# Patient Record
Sex: Female | Born: 1955
Health system: Southern US, Community
[De-identification: ages and names within clinical notes are randomized; demographics above are authoritative.]

## PROBLEM LIST (undated history)

## (undated) DIAGNOSIS — K219 Gastro-esophageal reflux disease without esophagitis: Secondary | ICD-10-CM

## (undated) DIAGNOSIS — E785 Hyperlipidemia, unspecified: Secondary | ICD-10-CM

## (undated) DIAGNOSIS — T7840XA Allergy, unspecified, initial encounter: Secondary | ICD-10-CM

## (undated) DIAGNOSIS — Z789 Other specified health status: Secondary | ICD-10-CM

## (undated) HISTORY — DX: Allergy, unspecified, initial encounter: T78.40XA

## (undated) HISTORY — DX: Gastro-esophageal reflux disease without esophagitis: K21.9

## (undated) HISTORY — PX: ABDOMINAL HYSTERECTOMY: SHX81

## (undated) HISTORY — DX: Hyperlipidemia, unspecified: E78.5

---
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
## 1998-07-25 ENCOUNTER — Ambulatory Visit (HOSPITAL_COMMUNITY): Admission: RE | Admit: 1998-07-25 | Discharge: 1998-07-25 | Payer: Self-pay | Admitting: *Deleted

## 2000-02-01 ENCOUNTER — Encounter: Payer: Self-pay | Admitting: General Practice

## 2000-02-01 ENCOUNTER — Other Ambulatory Visit: Admission: RE | Admit: 2000-02-01 | Discharge: 2000-02-01 | Payer: Self-pay | Admitting: *Deleted

## 2000-02-01 ENCOUNTER — Encounter (INDEPENDENT_AMBULATORY_CARE_PROVIDER_SITE_OTHER): Payer: Self-pay | Admitting: *Deleted

## 2000-02-01 ENCOUNTER — Encounter: Admission: RE | Admit: 2000-02-01 | Discharge: 2000-02-01 | Payer: Self-pay | Admitting: *Deleted

## 2001-06-13 ENCOUNTER — Encounter: Admission: RE | Admit: 2001-06-13 | Discharge: 2001-06-13 | Payer: Self-pay | Admitting: Obstetrics and Gynecology

## 2001-06-13 ENCOUNTER — Encounter: Payer: Self-pay | Admitting: Obstetrics and Gynecology

## 2002-06-23 ENCOUNTER — Encounter: Admission: RE | Admit: 2002-06-23 | Discharge: 2002-06-23 | Payer: Self-pay | Admitting: Obstetrics and Gynecology

## 2002-06-23 ENCOUNTER — Encounter: Payer: Self-pay | Admitting: Obstetrics and Gynecology

## 2003-08-03 ENCOUNTER — Encounter: Admission: RE | Admit: 2003-08-03 | Discharge: 2003-08-03 | Payer: Self-pay | Admitting: Obstetrics and Gynecology

## 2003-08-12 ENCOUNTER — Encounter: Admission: RE | Admit: 2003-08-12 | Discharge: 2003-08-12 | Payer: Self-pay | Admitting: Obstetrics and Gynecology

## 2004-08-10 ENCOUNTER — Encounter: Admission: RE | Admit: 2004-08-10 | Discharge: 2004-08-10 | Payer: Self-pay | Admitting: Obstetrics and Gynecology

## 2005-08-13 ENCOUNTER — Encounter: Admission: RE | Admit: 2005-08-13 | Discharge: 2005-08-13 | Payer: Self-pay | Admitting: Obstetrics and Gynecology

## 2005-09-14 ENCOUNTER — Ambulatory Visit (HOSPITAL_COMMUNITY): Admission: RE | Admit: 2005-09-14 | Discharge: 2005-09-14 | Payer: Self-pay | Admitting: Orthopedic Surgery

## 2005-09-25 ENCOUNTER — Encounter: Admission: RE | Admit: 2005-09-25 | Discharge: 2005-09-25 | Payer: Self-pay | Admitting: Obstetrics and Gynecology

## 2006-08-02 ENCOUNTER — Encounter: Admission: RE | Admit: 2006-08-02 | Discharge: 2006-08-02 | Payer: Self-pay | Admitting: Obstetrics and Gynecology

## 2007-08-05 ENCOUNTER — Encounter: Admission: RE | Admit: 2007-08-05 | Discharge: 2007-08-05 | Payer: Self-pay | Admitting: Obstetrics and Gynecology

## 2007-09-27 IMAGING — MG MM MAMMO SCREENING
4 series · 4 of 4 positions shown · non-contrast
Comparison: none

SCREENING MAMMOGRAM:
There is a  dense fibroglandular pattern.  No masses or malignant type calcifications are 
identified.  Compared with prior studies.

[R CC]
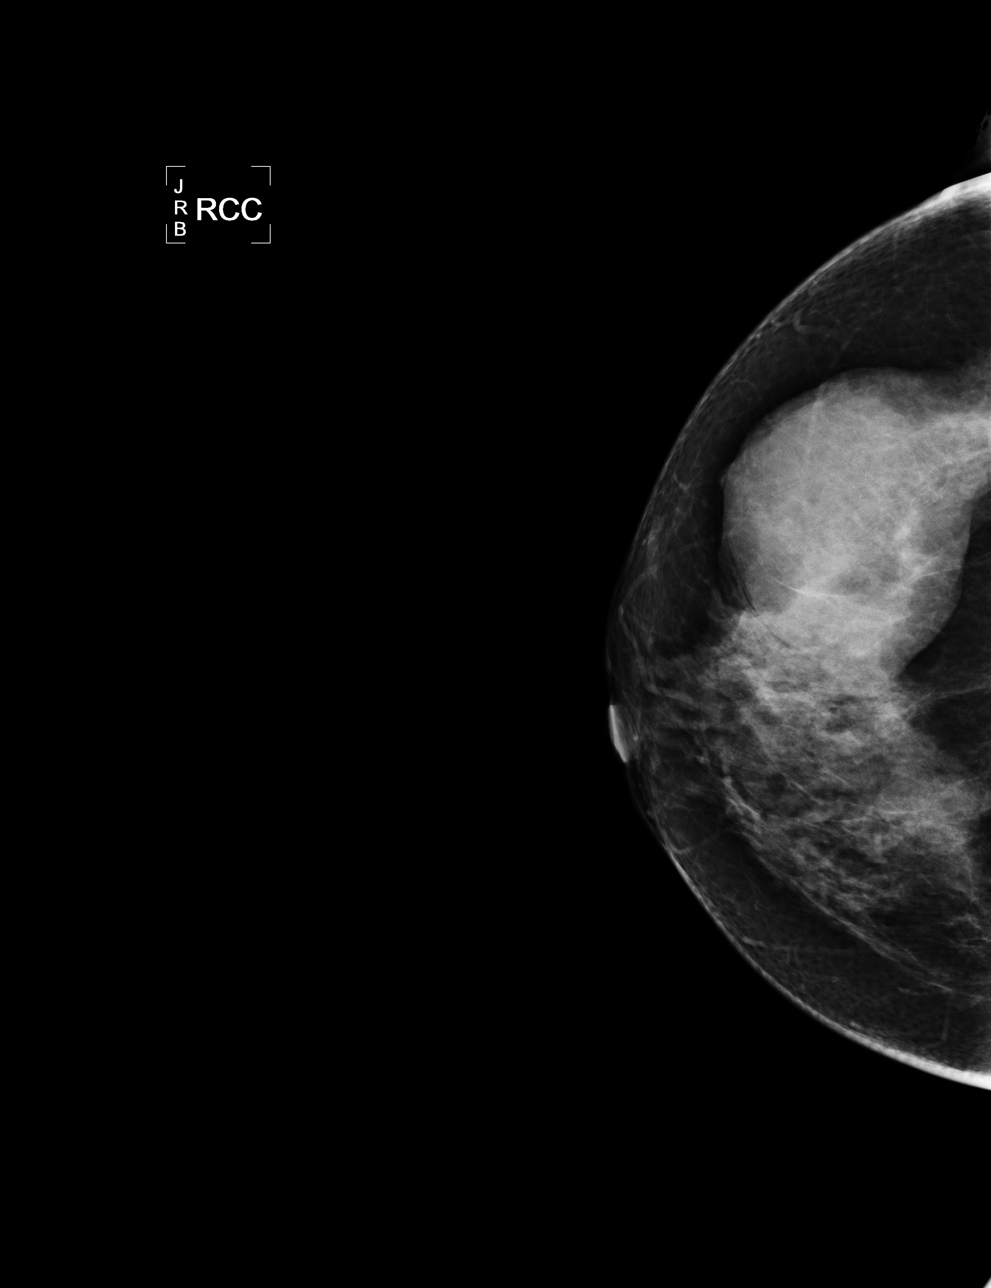

[R MLO]
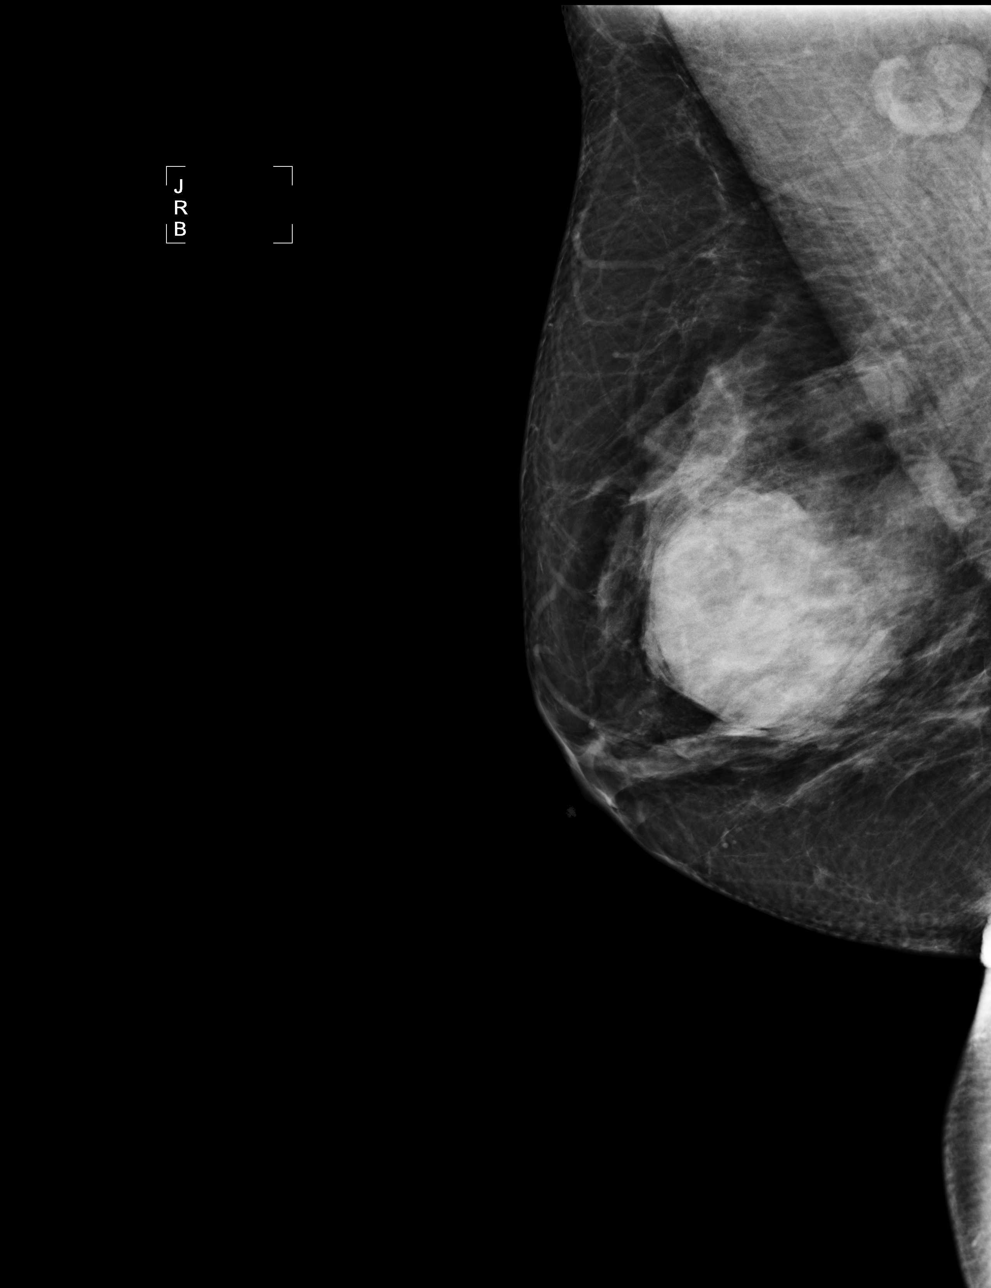

[L CC]
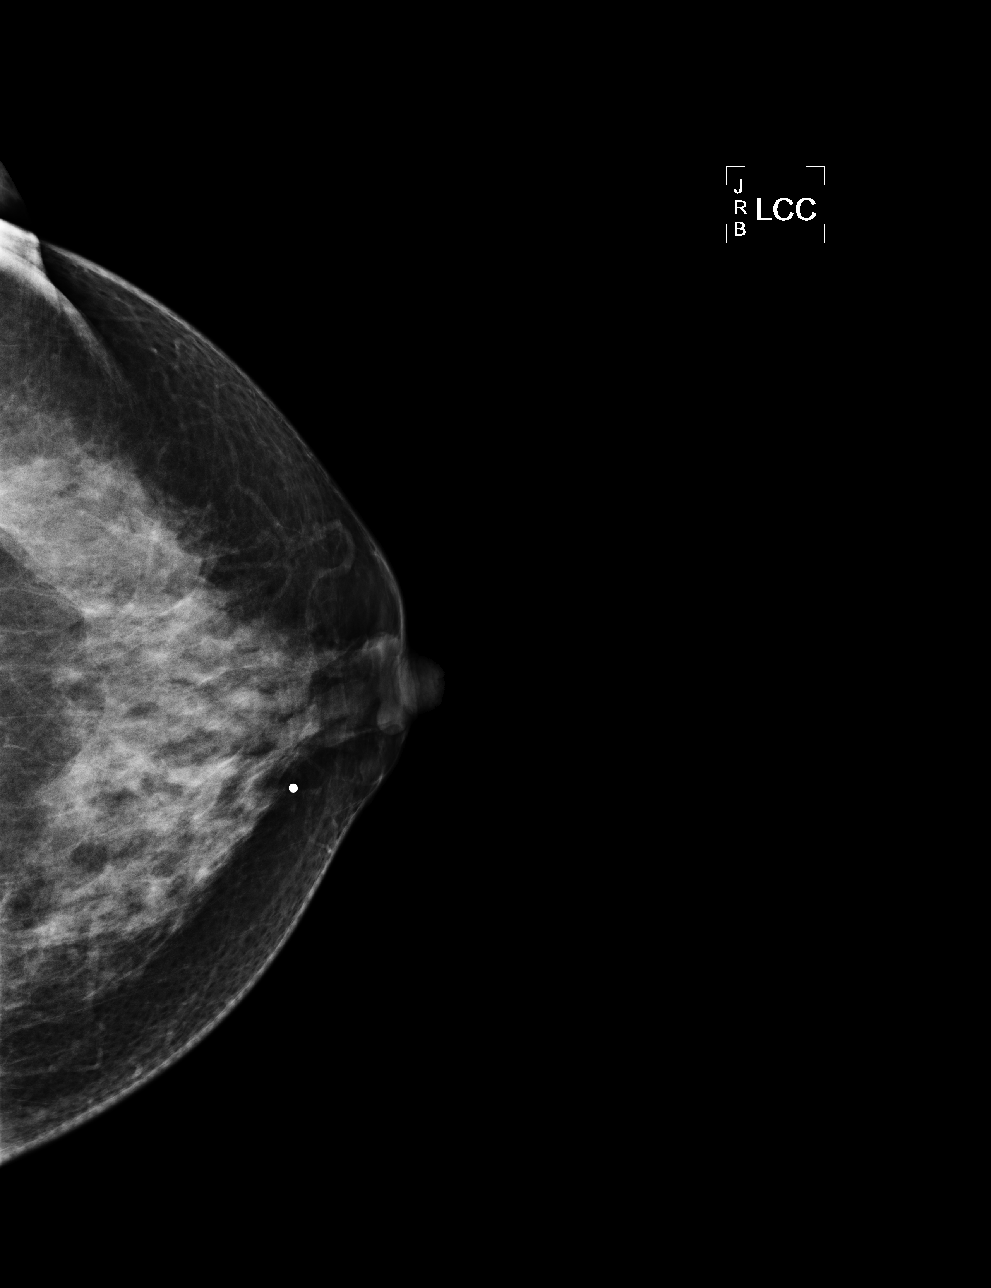

[L MLO]
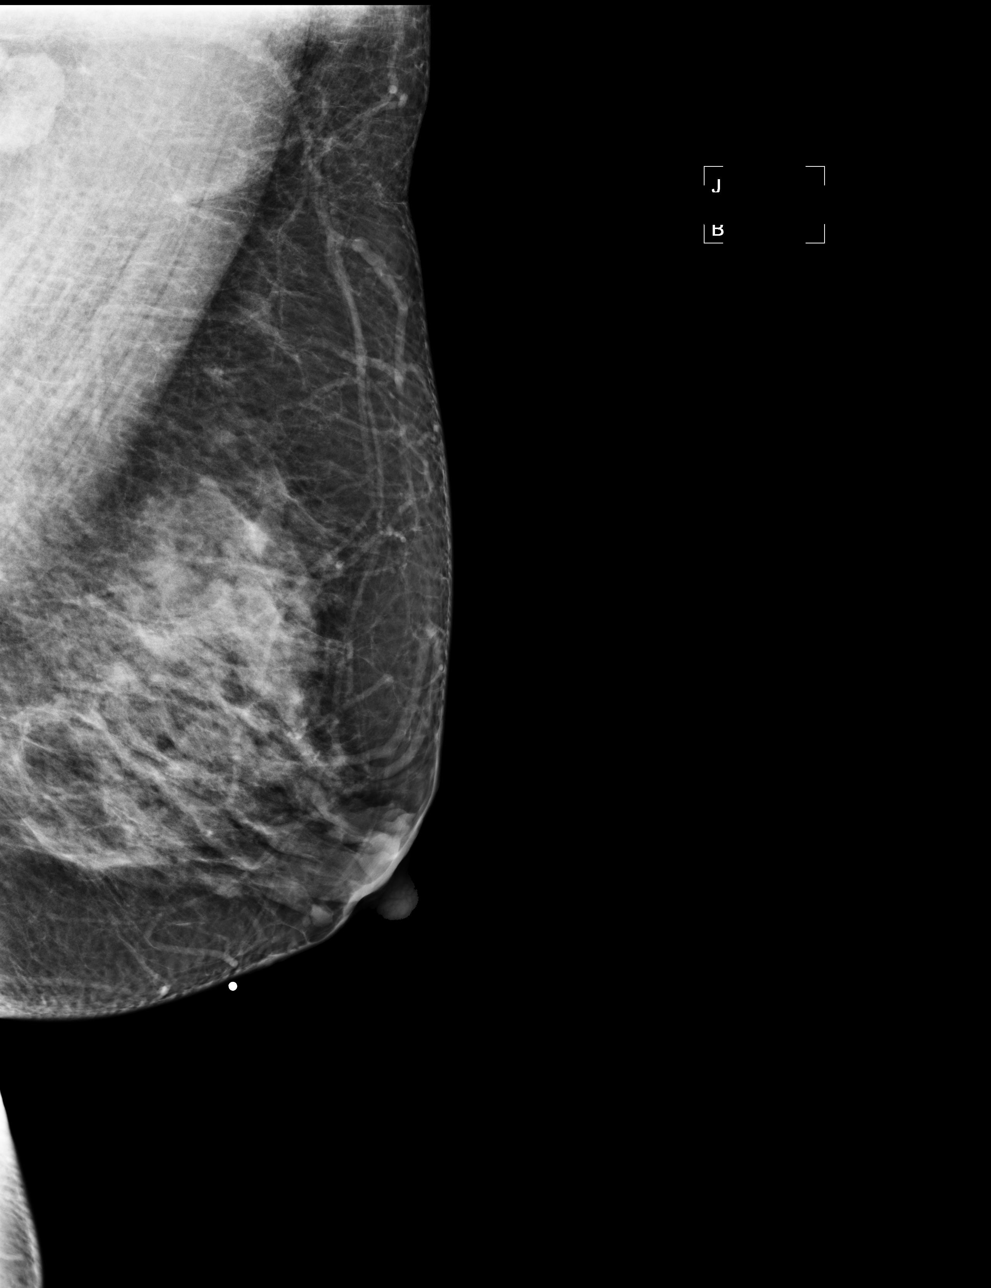

[4 of 4 positions shown; findings below may reference images not displayed]

IMPRESSION: No specific mammographic evidence of malignancy.  Next screening mammogram is recommended in one 
year.

ASSESSMENT: Negative - BI-RADS 1

Screening mammogram in 1 year.

## 2008-06-25 HISTORY — PX: COLONOSCOPY: SHX174

## 2008-08-05 ENCOUNTER — Encounter: Admission: RE | Admit: 2008-08-05 | Discharge: 2008-08-05 | Payer: Self-pay | Admitting: Obstetrics and Gynecology

## 2008-08-12 ENCOUNTER — Encounter: Admission: RE | Admit: 2008-08-12 | Discharge: 2008-08-12 | Payer: Self-pay | Admitting: Obstetrics and Gynecology

## 2009-03-22 ENCOUNTER — Ambulatory Visit: Payer: Self-pay | Admitting: Internal Medicine

## 2009-04-05 ENCOUNTER — Ambulatory Visit: Payer: Self-pay | Admitting: Internal Medicine

## 2009-11-03 ENCOUNTER — Encounter: Admission: RE | Admit: 2009-11-03 | Discharge: 2009-11-03 | Payer: Self-pay | Admitting: Obstetrics and Gynecology

## 2010-07-15 ENCOUNTER — Encounter: Payer: Self-pay | Admitting: Obstetrics and Gynecology

## 2010-07-16 ENCOUNTER — Encounter: Payer: Self-pay | Admitting: Obstetrics and Gynecology

## 2011-08-02 ENCOUNTER — Other Ambulatory Visit: Payer: Self-pay | Admitting: Obstetrics and Gynecology

## 2011-08-02 DIAGNOSIS — Z1231 Encounter for screening mammogram for malignant neoplasm of breast: Secondary | ICD-10-CM

## 2011-08-09 ENCOUNTER — Ambulatory Visit: Payer: Self-pay

## 2011-08-23 ENCOUNTER — Ambulatory Visit
Admission: RE | Admit: 2011-08-23 | Discharge: 2011-08-23 | Disposition: A | Discharge status: Home / Self Care | Payer: BC Managed Care – PPO | Source: Ambulatory Visit | Attending: Obstetrics and Gynecology | Admitting: Obstetrics and Gynecology

## 2011-08-23 DIAGNOSIS — Z1231 Encounter for screening mammogram for malignant neoplasm of breast: Secondary | ICD-10-CM

## 2013-01-13 ENCOUNTER — Emergency Department (HOSPITAL_COMMUNITY): Payer: No Typology Code available for payment source

## 2013-01-13 ENCOUNTER — Emergency Department (HOSPITAL_COMMUNITY)
Admission: EM | Admit: 2013-01-13 | Discharge: 2013-01-13 | Disposition: A | Discharge status: Home / Self Care | Payer: No Typology Code available for payment source | Attending: Emergency Medicine | Admitting: Emergency Medicine

## 2013-01-13 ENCOUNTER — Encounter (HOSPITAL_COMMUNITY): Payer: Self-pay | Admitting: Emergency Medicine

## 2013-01-13 DIAGNOSIS — Y9389 Activity, other specified: Secondary | ICD-10-CM | POA: Insufficient documentation

## 2013-01-13 DIAGNOSIS — S339XXA Sprain of unspecified parts of lumbar spine and pelvis, initial encounter: Secondary | ICD-10-CM | POA: Insufficient documentation

## 2013-01-13 DIAGNOSIS — Z79899 Other long term (current) drug therapy: Secondary | ICD-10-CM | POA: Insufficient documentation

## 2013-01-13 DIAGNOSIS — S39012A Strain of muscle, fascia and tendon of lower back, initial encounter: Secondary | ICD-10-CM

## 2013-01-13 DIAGNOSIS — Y92410 Unspecified street and highway as the place of occurrence of the external cause: Secondary | ICD-10-CM | POA: Insufficient documentation

## 2013-01-13 DIAGNOSIS — S298XXA Other specified injuries of thorax, initial encounter: Secondary | ICD-10-CM | POA: Insufficient documentation

## 2013-01-13 MED ORDER — CYCLOBENZAPRINE HCL 10 MG PO TABS: 10.0000 mg | ORAL_TABLET | Freq: Two times a day (BID) | ORAL | Status: DC | PRN

## 2013-01-13 MED ORDER — TRAMADOL HCL 50 MG PO TABS: 50.0000 mg | ORAL_TABLET | Freq: Four times a day (QID) | ORAL | Status: DC | PRN

## 2013-01-13 MED ORDER — OXYCODONE-ACETAMINOPHEN 5-325 MG PO TABS
1.0000 | ORAL_TABLET | Freq: Once | ORAL | Status: AC
  Administered 2013-01-13: 1 via ORAL
  Filled 2013-01-13: qty 1

## 2013-01-13 MED ORDER — IBUPROFEN 600 MG PO TABS: 600.0000 mg | ORAL_TABLET | Freq: Four times a day (QID) | ORAL | Status: DC | PRN
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
## 2013-01-15 NOTE — ED Provider Notes (Signed)
Medical screening examination/treatment/procedure(s) were performed by non-physician practitioner and as supervising physician I was immediately available for consultation/collaboration.   Loren Racer, MD 01/15/13 [phone]

## 2013-08-03 ENCOUNTER — Other Ambulatory Visit: Payer: Self-pay

## 2013-08-03 DIAGNOSIS — Z1231 Encounter for screening mammogram for malignant neoplasm of breast: Secondary | ICD-10-CM

## 2013-09-15 ENCOUNTER — Ambulatory Visit
Admission: RE | Admit: 2013-09-15 | Discharge: 2013-09-15 | Disposition: A | Discharge status: Home / Self Care | Payer: 59 | Source: Ambulatory Visit

## 2013-09-15 DIAGNOSIS — Z1231 Encounter for screening mammogram for malignant neoplasm of breast: Secondary | ICD-10-CM

## 2013-09-17 ENCOUNTER — Other Ambulatory Visit: Payer: Self-pay | Admitting: Obstetrics and Gynecology

## 2013-09-17 DIAGNOSIS — R928 Other abnormal and inconclusive findings on diagnostic imaging of breast: Secondary | ICD-10-CM

## 2013-09-24 ENCOUNTER — Ambulatory Visit: Payer: BC Managed Care – PPO

## 2013-10-05 ENCOUNTER — Ambulatory Visit
Admission: RE | Admit: 2013-10-05 | Discharge: 2013-10-05 | Disposition: A | Discharge status: Home / Self Care | Payer: 59 | Source: Ambulatory Visit | Attending: Obstetrics and Gynecology | Admitting: Obstetrics and Gynecology

## 2013-10-05 DIAGNOSIS — R928 Other abnormal and inconclusive findings on diagnostic imaging of breast: Secondary | ICD-10-CM

## 2014-04-09 DIAGNOSIS — R350 Frequency of micturition: Secondary | ICD-10-CM | POA: Insufficient documentation

## 2014-04-09 DIAGNOSIS — R351 Nocturia: Secondary | ICD-10-CM | POA: Insufficient documentation

## 2014-06-25 HISTORY — PX: BREAST BIOPSY: SHX20

## 2014-08-30 ENCOUNTER — Other Ambulatory Visit: Payer: Self-pay

## 2014-08-30 DIAGNOSIS — Z1231 Encounter for screening mammogram for malignant neoplasm of breast: Secondary | ICD-10-CM

## 2014-09-08 DIAGNOSIS — M6740 Ganglion, unspecified site: Secondary | ICD-10-CM | POA: Insufficient documentation

## 2014-09-17 ENCOUNTER — Ambulatory Visit
Admission: RE | Admit: 2014-09-17 | Discharge: 2014-09-17 | Disposition: A | Discharge status: Home / Self Care | Payer: 59 | Source: Ambulatory Visit

## 2014-09-17 DIAGNOSIS — Z1231 Encounter for screening mammogram for malignant neoplasm of breast: Secondary | ICD-10-CM

## 2014-09-24 ENCOUNTER — Other Ambulatory Visit: Payer: Self-pay | Admitting: Orthopedic Surgery

## 2014-12-24 ENCOUNTER — Encounter (HOSPITAL_BASED_OUTPATIENT_CLINIC_OR_DEPARTMENT_OTHER): Payer: Self-pay | Admitting: *Deleted

## 2014-12-30 ENCOUNTER — Ambulatory Visit (HOSPITAL_BASED_OUTPATIENT_CLINIC_OR_DEPARTMENT_OTHER): Payer: 59 | Admitting: Anesthesiology

## 2014-12-30 ENCOUNTER — Encounter (HOSPITAL_BASED_OUTPATIENT_CLINIC_OR_DEPARTMENT_OTHER): Payer: Self-pay | Admitting: Orthopedic Surgery

## 2014-12-30 ENCOUNTER — Ambulatory Visit (HOSPITAL_BASED_OUTPATIENT_CLINIC_OR_DEPARTMENT_OTHER)
Admission: RE | Admit: 2014-12-30 | Discharge: 2014-12-30 | Disposition: A | Discharge status: Home / Self Care | Payer: 59 | Source: Ambulatory Visit | Attending: Orthopedic Surgery | Admitting: Orthopedic Surgery

## 2014-12-30 ENCOUNTER — Encounter (HOSPITAL_BASED_OUTPATIENT_CLINIC_OR_DEPARTMENT_OTHER)
Admission: RE | Disposition: A | Discharge status: Home / Self Care | Payer: Self-pay | Source: Ambulatory Visit | Attending: Orthopedic Surgery

## 2014-12-30 DIAGNOSIS — L729 Follicular cyst of the skin and subcutaneous tissue, unspecified: Secondary | ICD-10-CM | POA: Diagnosis present

## 2014-12-30 DIAGNOSIS — G5602 Carpal tunnel syndrome, left upper limb: Secondary | ICD-10-CM | POA: Diagnosis not present

## 2014-12-30 DIAGNOSIS — M71342 Other bursal cyst, left hand: Secondary | ICD-10-CM | POA: Diagnosis not present

## 2014-12-30 DIAGNOSIS — Z7982 Long term (current) use of aspirin: Secondary | ICD-10-CM | POA: Diagnosis not present

## 2014-12-30 HISTORY — PX: CARPAL TUNNEL RELEASE: SHX101

## 2014-12-30 HISTORY — DX: Other specified health status: Z78.9

## 2014-12-30 HISTORY — PX: MASS EXCISION: SHX2000

## 2014-12-30 LAB — POCT HEMOGLOBIN-HEMACUE: Hemoglobin: 13.4 g/dL (ref 12.0–15.0)

## 2014-12-30 SURGERY — EXCISION MASS
Anesthesia: Monitor Anesthesia Care | Site: Wrist | Laterality: Left

## 2014-12-30 MED ORDER — HYDROMORPHONE HCL 1 MG/ML IJ SOLN
0.2500 mg | INTRAMUSCULAR | Status: DC | PRN
  Administered 2014-12-30: 0.5 mg via INTRAVENOUS

## 2014-12-30 MED ORDER — CEFAZOLIN SODIUM-DEXTROSE 2-3 GM-% IV SOLR: 2.0000 g | INTRAVENOUS | Status: DC

## 2014-12-30 MED ORDER — HYDROCODONE-ACETAMINOPHEN 5-325 MG PO TABS: 1.0000 | ORAL_TABLET | Freq: Four times a day (QID) | ORAL | Status: AC | PRN

## 2014-12-30 MED ORDER — LIDOCAINE HCL (PF) 0.5 % IJ SOLN
INTRAMUSCULAR | Status: DC | PRN
  Administered 2014-12-30: 30 mL via INTRAVENOUS

## 2014-12-30 MED ORDER — PROMETHAZINE HCL 25 MG/ML IJ SOLN: 6.2500 mg | INTRAMUSCULAR | Status: DC | PRN

## 2014-12-30 MED ORDER — FENTANYL CITRATE (PF) 100 MCG/2ML IJ SOLN
INTRAMUSCULAR | Status: AC
  Filled 2014-12-30: qty 6

## 2014-12-30 MED ORDER — PROPOFOL 500 MG/50ML IV EMUL
INTRAVENOUS | Status: AC
  Filled 2014-12-30: qty 100

## 2014-12-30 MED ORDER — CEFAZOLIN SODIUM-DEXTROSE 2-3 GM-% IV SOLR
2.0000 g | INTRAVENOUS | Status: AC
  Administered 2014-12-30: 2 g via INTRAVENOUS

## 2014-12-30 MED ORDER — CHLORHEXIDINE GLUCONATE 4 % EX LIQD: 60.0000 mL | Freq: Once | CUTANEOUS | Status: DC

## 2014-12-30 MED ORDER — CEFAZOLIN SODIUM-DEXTROSE 2-3 GM-% IV SOLR
INTRAVENOUS | Status: AC
  Filled 2014-12-30: qty 50

## 2014-12-30 MED ORDER — GLYCOPYRROLATE 0.2 MG/ML IJ SOLN: 0.2000 mg | Freq: Once | INTRAMUSCULAR | Status: DC | PRN

## 2014-12-30 MED ORDER — BUPIVACAINE HCL (PF) 0.25 % IJ SOLN
INTRAMUSCULAR | Status: DC | PRN
  Administered 2014-12-30: 10 mL

## 2014-12-30 MED ORDER — BUPIVACAINE HCL (PF) 0.25 % IJ SOLN
INTRAMUSCULAR | Status: AC
  Filled 2014-12-30: qty 150

## 2014-12-30 MED ORDER — LIDOCAINE HCL (PF) 1 % IJ SOLN
INTRAMUSCULAR | Status: AC
  Filled 2014-12-30: qty 60

## 2014-12-30 MED ORDER — OXYCODONE HCL 5 MG PO TABS
5.0000 mg | ORAL_TABLET | Freq: Once | ORAL | Status: AC | PRN
  Administered 2014-12-30: 5 mg via ORAL

## 2014-12-30 MED ORDER — OXYCODONE HCL 5 MG/5ML PO SOLN: 5.0000 mg | Freq: Once | ORAL | Status: AC | PRN

## 2014-12-30 MED ORDER — MIDAZOLAM HCL 5 MG/5ML IJ SOLN
INTRAMUSCULAR | Status: DC | PRN
  Administered 2014-12-30: 2 mg via INTRAVENOUS

## 2014-12-30 MED ORDER — FENTANYL CITRATE (PF) 100 MCG/2ML IJ SOLN
INTRAMUSCULAR | Status: DC | PRN
  Administered 2014-12-30: 50 ug via INTRAVENOUS

## 2014-12-30 MED ORDER — HYDROMORPHONE HCL 1 MG/ML IJ SOLN
INTRAMUSCULAR | Status: AC
  Filled 2014-12-30: qty 1

## 2014-12-30 MED ORDER — MIDAZOLAM HCL 2 MG/2ML IJ SOLN
INTRAMUSCULAR | Status: AC
  Filled 2014-12-30: qty 2

## 2014-12-30 MED ORDER — PROPOFOL 10 MG/ML IV BOLUS
INTRAVENOUS | Status: AC
  Filled 2014-12-30: qty 100

## 2014-12-30 MED ORDER — PROPOFOL INFUSION 10 MG/ML OPTIME
INTRAVENOUS | Status: DC | PRN
Start: 2014-12-30 — End: 2014-12-30
  Administered 2014-12-30: 25 ug/kg/min via INTRAVENOUS

## 2014-12-30 MED ORDER — KETOROLAC TROMETHAMINE 30 MG/ML IJ SOLN: 30.0000 mg | Freq: Once | INTRAMUSCULAR | Status: DC | PRN

## 2014-12-30 MED ORDER — OXYCODONE HCL 5 MG PO TABS
ORAL_TABLET | ORAL | Status: AC
  Filled 2014-12-30: qty 1

## 2014-12-30 MED ORDER — LACTATED RINGERS IV SOLN
INTRAVENOUS | Status: DC
  Administered 2014-12-30: 09:00:00 via INTRAVENOUS

## 2014-12-30 MED ORDER — ONDANSETRON HCL 4 MG/2ML IJ SOLN
INTRAMUSCULAR | Status: DC | PRN
  Administered 2014-12-30: 4 mg via INTRAVENOUS

## 2014-12-30 MED ORDER — SCOPOLAMINE 1 MG/3DAYS TD PT72: 1.0000 | MEDICATED_PATCH | Freq: Once | TRANSDERMAL | Status: DC | PRN

## 2014-12-30 SURGICAL SUPPLY — 61 items
BANDAGE COBAN STERILE 2 (GAUZE/BANDAGES/DRESSINGS) IMPLANT
BLADE MINI RND TIP GREEN BEAV (BLADE) ×2 IMPLANT
BLADE SURG 15 STRL LF DISP TIS (BLADE) ×3 IMPLANT
BLADE SURG 15 STRL SS (BLADE) ×4
BNDG CMPR 9X4 STRL LF SNTH (GAUZE/BANDAGES/DRESSINGS)
BNDG COHESIVE 1X5 TAN STRL LF (GAUZE/BANDAGES/DRESSINGS) ×2 IMPLANT
BNDG COHESIVE 3X5 TAN STRL LF (GAUZE/BANDAGES/DRESSINGS) ×4 IMPLANT
BNDG ESMARK 4X9 LF (GAUZE/BANDAGES/DRESSINGS) ×2 IMPLANT
BNDG GAUZE ELAST 4 BULKY (GAUZE/BANDAGES/DRESSINGS) ×4 IMPLANT
BUR FAST CUTTING MED (BURR) IMPLANT
CHLORAPREP W/TINT 26ML (MISCELLANEOUS) ×4 IMPLANT
CORDS BIPOLAR (ELECTRODE) ×4 IMPLANT
COVER BACK TABLE 60X90IN (DRAPES) ×4 IMPLANT
COVER MAYO STAND STRL (DRAPES) ×4 IMPLANT
CUFF TOURNIQUET SINGLE 18IN (TOURNIQUET CUFF) ×4 IMPLANT
DECANTER SPIKE VIAL GLASS SM (MISCELLANEOUS) IMPLANT
DRAIN PENROSE 1/2X12 LTX STRL (WOUND CARE) IMPLANT
DRAPE EXTREMITY T 121X128X90 (DRAPE) ×4 IMPLANT
DRAPE OEC MINIVIEW 54X84 (DRAPES) ×2 IMPLANT
DRAPE SURG 17X23 STRL (DRAPES) ×4 IMPLANT
DRSG KUZMA FLUFF (GAUZE/BANDAGES/DRESSINGS) ×2 IMPLANT
DRSG PAD ABDOMINAL 8X10 ST (GAUZE/BANDAGES/DRESSINGS) ×4 IMPLANT
GAUZE SPONGE 4X4 12PLY STRL (GAUZE/BANDAGES/DRESSINGS) ×4 IMPLANT
GAUZE XEROFORM 1X8 LF (GAUZE/BANDAGES/DRESSINGS) ×4 IMPLANT
GLOVE BIO SURGEON STRL SZ7.5 (GLOVE) ×2 IMPLANT
GLOVE BIOGEL PI IND STRL 7.0 (GLOVE) ×2 IMPLANT
GLOVE BIOGEL PI IND STRL 8.5 (GLOVE) ×3 IMPLANT
GLOVE BIOGEL PI INDICATOR 7.0 (GLOVE) ×2
GLOVE BIOGEL PI INDICATOR 8.5 (GLOVE) ×1
GLOVE ECLIPSE 6.5 STRL STRAW (GLOVE) ×2 IMPLANT
GLOVE SURG ORTHO 8.0 STRL STRW (GLOVE) ×4 IMPLANT
GLOVE SURG SS PI 7.5 STRL IVOR (GLOVE) ×2 IMPLANT
GOWN STRL REUS W/ TWL LRG LVL3 (GOWN DISPOSABLE) ×3 IMPLANT
GOWN STRL REUS W/TWL LRG LVL3 (GOWN DISPOSABLE) ×4
GOWN STRL REUS W/TWL XL LVL3 (GOWN DISPOSABLE) ×4 IMPLANT
NDL PRECISIONGLIDE 27X1.5 (NEEDLE) IMPLANT
NDL SAFETY ECLIPSE 18X1.5 (NEEDLE) IMPLANT
NEEDLE HYPO 18GX1.5 SHARP (NEEDLE)
NEEDLE PRECISIONGLIDE 27X1.5 (NEEDLE) ×4 IMPLANT
NS IRRIG 1000ML POUR BTL (IV SOLUTION) ×4 IMPLANT
PACK BASIN DAY SURGERY FS (CUSTOM PROCEDURE TRAY) ×4 IMPLANT
PAD CAST 3X4 CTTN HI CHSV (CAST SUPPLIES) ×3 IMPLANT
PADDING CAST ABS 3INX4YD NS (CAST SUPPLIES)
PADDING CAST ABS 4INX4YD NS (CAST SUPPLIES) ×1
PADDING CAST ABS COTTON 3X4 (CAST SUPPLIES) IMPLANT
PADDING CAST ABS COTTON 4X4 ST (CAST SUPPLIES) ×3 IMPLANT
PADDING CAST COTTON 3X4 STRL (CAST SUPPLIES) ×4
SLEEVE SCD COMPRESS KNEE MED (MISCELLANEOUS) IMPLANT
SPLINT FINGER 3.25 911903 (SOFTGOODS) ×2 IMPLANT
SPLINT PLASTER CAST XFAST 3X15 (CAST SUPPLIES) IMPLANT
SPLINT PLASTER XTRA FASTSET 3X (CAST SUPPLIES)
STOCKINETTE 4X48 STRL (DRAPES) ×4 IMPLANT
SUT ETHILON 4 0 PS 2 18 (SUTURE) ×4 IMPLANT
SUT VIC AB 4-0 P2 18 (SUTURE) IMPLANT
SUT VIC AB 5-0 P-3 18X BRD (SUTURE) ×1 IMPLANT
SUT VIC AB 5-0 P3 18 (SUTURE) ×4
SUT VICRYL 4-0 PS2 18IN ABS (SUTURE) ×4 IMPLANT
SYR BULB 3OZ (MISCELLANEOUS) ×4 IMPLANT
SYR CONTROL 10ML LL (SYRINGE) ×2 IMPLANT
TOWEL OR 17X24 6PK STRL BLUE (TOWEL DISPOSABLE) ×4 IMPLANT
UNDERPAD 30X30 (UNDERPADS AND DIAPERS) ×4 IMPLANT

## 2014-12-30 NOTE — Anesthesia Procedure Notes (Signed)
Procedure Name: MAC Date/Time: 12/30/2014 9:45 AM Performed by: Marrianne Mood Pre-anesthesia Checklist: Patient identified, Timeout performed, Emergency Drugs available, Suction available and Patient being monitored Patient Re-evaluated:Patient Re-evaluated prior to inductionOxygen Delivery Method: Simple face mask

## 2014-12-30 NOTE — Anesthesia Preprocedure Evaluation (Addendum)
Anesthesia Evaluation  Patient identified by MRN, date of birth, ID band Patient awake    Reviewed: Allergy & Precautions, NPO status , Patient's Chart, lab work & pertinent test results  Airway Mallampati: II  TM Distance: >3 FB Neck ROM: Full    Dental   Pulmonary neg pulmonary ROS,  breath sounds clear to auscultation        Cardiovascular negative cardio ROS  Rhythm:Regular Rate:Normal     Neuro/Psych negative neurological ROS     GI/Hepatic negative GI ROS, Neg liver ROS,   Endo/Other  negative endocrine ROS  Renal/GU negative Renal ROS     Musculoskeletal   Abdominal   Peds  Hematology negative hematology ROS (+)   Anesthesia Other Findings   Reproductive/Obstetrics                            Anesthesia Physical Anesthesia Plan  ASA: I  Anesthesia Plan: MAC and Bier Block   Post-op Pain Management:    Induction: Intravenous  Airway Management Planned: Natural Airway and Simple Face Mask  Additional Equipment:   Intra-op Plan:   Post-operative Plan:   Informed Consent: I have reviewed the patients History and Physical, chart, labs and discussed the procedure including the risks, benefits and alternatives for the proposed anesthesia with the patient or authorized representative who has indicated his/her understanding and acceptance.     Plan Discussed with: CRNA  Anesthesia Plan Comments:        Anesthesia Quick Evaluation

## 2014-12-30 NOTE — Anesthesia Postprocedure Evaluation (Signed)
  Anesthesia Post-op Note  Patient: Debra Humphrey  Procedure(s) Performed: Procedure(s): EXCISION OF CYSTIC MASS WITH DEBRIDMENT OF PROXIMAL INTERPHALANGEAL JOINT (Left) LEFT CARPAL TUNNEL RELEASE (Left)  Patient Location: PACU  Anesthesia Type:General and Bier block  Level of Consciousness: awake, alert  and oriented  Airway and Oxygen Therapy: Patient Spontanous Breathing  Post-op Pain: mild  Post-op Assessment: Post-op Vital signs reviewed              Post-op Vital Signs: Reviewed  Last Vitals:  Filed Vitals:   12/30/14 1135  BP: 109/74  Pulse: 62  Temp: 36.6 C  Resp: 14    Complications: No apparent anesthesia complications

## 2014-12-30 NOTE — Brief Op Note (Signed)
12/30/2014  10:34 AM  PATIENT:  Debra Humphrey  59 y.o. female  PRE-OPERATIVE DIAGNOSIS:  MUCOID CYST LEFT LONG FINGER PROXIMAL INTERPHANLANGEAL JOINT/LEFT CARPAL TUNNEL SYNDROME  POST-OPERATIVE DIAGNOSIS:  MUCOID CYST LEFT LONG FINGER PROXIMAL INTERPHANLANGEAL JOINT/LEFT CARPAL TUNNEL SYNDROME  PROCEDURE:  Procedure(s): EXCISION OF CYSTIC MASS WITH DEBRIDMENT OF PROXIMAL INTERPHALANGEAL JOINT (Left) LEFT CARPAL TUNNEL RELEASE (Left)  SURGEON:  Surgeon(s) and Role:    * Daryll Brod, MD - Primary  PHYSICIAN ASSISTANT:   ASSISTANTS: none   ANESTHESIA:   local and regional  EBL:  Total I/O In: 500 [I.V.:500] Out: -   BLOOD ADMINISTERED:none  DRAINS: none   LOCAL MEDICATIONS USED:  BUPIVICAINE   SPECIMEN:  Excision  DISPOSITION OF SPECIMEN:  PATHOLOGY  COUNTS:  YES  TOURNIQUET:   Total Tourniquet Time Documented: Forearm (Left) - 34 minutes Total: Forearm (Left) - 34 minutes   DICTATION: .Other Dictation: Dictation Number [id number]  PLAN OF CARE: Discharge to home after PACU  PATIENT DISPOSITION:  PACU - hemodynamically stable.

## 2014-12-30 NOTE — Transfer of Care (Signed)
Immediate Anesthesia Transfer of Care Note  Patient: Debra Humphrey  Procedure(s) Performed: Procedure(s): EXCISION OF CYSTIC MASS WITH DEBRIDMENT OF PROXIMAL INTERPHALANGEAL JOINT (Left) LEFT CARPAL TUNNEL RELEASE (Left)  Patient Location: PACU  Anesthesia Type:Bier block  Level of Consciousness: awake and unresponsive  Airway & Oxygen Therapy: Patient Spontanous Breathing and Patient connected to face mask oxygen  Post-op Assessment: Report given to RN and Post -op Vital signs reviewed and stable  Post vital signs: Reviewed and stable  Last Vitals:  Filed Vitals:   12/30/14 0852  BP: 140/62  Pulse: 72  Temp: 37 C  Resp: 16    Complications: No apparent anesthesia complications

## 2014-12-30 NOTE — Discharge Instructions (Signed)

## 2014-12-30 NOTE — Op Note (Signed)
Dictation Number [id number]

## 2014-12-30 NOTE — H&P (Signed)
Debra Humphrey is a 59 yo female with a mass on her left middle finger, PIP joint area.  She is a Glass blower/designer at [employer]. She states this has enlarged. She was last seen in 2013. This was aspirated, but has recurred. She wants to discuss surgery.  She continues to complain of numbness and tingling in both hands awakening her at night 2 out of 7 nights. She states it is getting more constant.  She complains of an intermittent, mild, dull aching pain with a feeling of swelling, numbness and weakness.  Ice has helped.     ALLERGIES:   None.  MEDICATIONS:    Aspirin a day.  SURGICAL HISTORY:     None.  FAMILY MEDICAL HISTORY:    Positive for high blood pressure.  SOCIAL HISTORY:     She does not smoke or drink.  She is married.    REVIEW OF SYSTEMS:   Positive for glasses, lump on her finger, otherwise negative.  Debra Humphrey is an 59 y.o. female.   Chief Complaint: numbness left hand and mass left middle finger HPI: see above  Past Medical History  Diagnosis Date  . Medical history non-contributory     Past Surgical History  Procedure Laterality Date  . Breast biopsy  2016  . Abdominal hysterectomy      History reviewed. No pertinent family history. Social History:  reports that she has never smoked. She does not have any smokeless tobacco history on file. She reports that she does not drink alcohol or use illicit drugs.  Allergies: No Known Allergies  No prescriptions prior to admission    No results found for this or any previous visit (from the past 48 hour(s)).  No results found.   Pertinent items are noted in HPI.  Height 5\' 3"  (1.6 m), weight 69.4 kg (153 lb).  General appearance: alert, cooperative and appears stated age Head: Normocephalic, without obvious abnormality Neck: no JVD Resp: clear to auscultation bilaterally Cardio: regular rate and rhythm, S1, S2 normal, no murmur, click, rub or gallop GI: soft, non-tender; bowel sounds normal; no  masses,  no organomegaly Extremities: numbness left hand and mass middle finger Pulses: 2+ and symmetric Skin: Skin color, texture, turgor normal. No rashes or lesions Neurologic: Grossly normal Incision/Wound: na  Assessment/Plan RADIOGRAPHS:     X-rays reveal minimal degenerative changes to the PIP joint.  DIAGNOSIS: Carpal tunnel syndrome with mucoid cyst left long finger.    NERVE STUDIES:    She had nerve conductions done by Dr. Zebedee Iba revealing carpal tunnel syndrome bilaterally with motor delay of 6.7/left and 5.3/right; sensory delay 3.6/left and 2.7/right; amplitude diminution to 14/left and 21.3/right.    RECOMMENDATIONS/PLAN:     We have discussed possibility of excision to the cyst with debridement of the PIP joint, possible carpal tunnel release.  The pre, peri and postoperative course were discussed along with the risks and complications.  The patient is aware there is no guarantee with the surgery, possibility of infection, recurrence, injury to arteries, nerves, tendons, incomplete relief of symptoms and dystrophy.  She would like to think this over. We have encouraged her to do so.  I will see her back in a week to ten days for possibility of scheduling for either simple excision of the cyst with debridement of the PIP joint or excision of the cyst, debridement of the joint carpal tunnel release on the left side.  Jais Demir R 12/30/2014, 7:36 AM

## 2014-12-31 ENCOUNTER — Encounter (HOSPITAL_BASED_OUTPATIENT_CLINIC_OR_DEPARTMENT_OTHER): Payer: Self-pay | Admitting: Orthopedic Surgery

## 2014-12-31 NOTE — Op Note (Signed)
Debra Humphrey, Debra Humphrey               ACCOUNT NO.:  [id number]  MEDICAL RECORD NO.:  [id number]  LOCATION:                               FACILITY:  [facility]  PHYSICIAN:  Daryll Brod, M.D.       DATE OF BIRTH:  29-Mar-1956  DATE OF PROCEDURE:  12/30/2014 DATE OF DISCHARGE:  12/30/2014                              OPERATIVE REPORT   PREOPERATIVE DIAGNOSIS:  Carpal tunnel syndrome, left hand with mucoid tumor, left middle finger PIP joint.  POSTOPERATIVE DIAGNOSIS:  Carpal tunnel syndrome, left hand with mucoid tumor, left middle finger PIP joint.  OPERATION:  Decompression of median nerve, left wrist with excision of large mucoid cyst, and debridement of proximal interphalangeal joint, left middle finger.  SURGEON:  Daryll Brod, M.D.  ANESTHESIA:  Forearm-based IV regional with local infiltration metacarpal block.  ANESTHESIOLOGIST:  Soledad Gerlach, MD  HISTORY:  The patient is a 59 year old female with a history of carpal tunnel syndrome, nerve conduction is positive, and this is not responded to conservative treatment.  She also has a large mass over the PIP joint of her middle finger.  She is desirous proceeding to have the carpal tunnel release along with excision of the mass and debridement of the joint.  Pre, peri, and postoperative courses have been discussed along with risks and complications.  She is aware that there is no guarantee with the surgery; possibility of infection; recurrence of injury to arteries, nerves, tendons; incomplete relief of symptoms; dystrophy; possibility of recurrence of the mass; stiffness to the PIP joint; degenerative changes being present.  In the preoperative area, the patient is seen, the extremity marked by both patient and surgeon. Antibiotic given.  PROCEDURE IN DETAIL:  The patient was brought to the operating room, where forearm-based IV regional anesthetic was carried out without difficulty.  She was prepped using ChloraPrep in  supine position with the left arm free.  A 3-minute dry time was allowed.  Time-out taken, confirming the patient and procedure.  A longitudinal incision was made in the left palm, carried down through subcutaneous tissue.  Bleeders were electrocauterized.  Palmar fascia was split.  Superficial palmar arch identified.  The flexor tendon in the ring, little finger identified to the ulnar side of the median nerve.  The carpal retinaculum was incised with sharp dissection.  A right-angle and Sewall retractor were placed between skin and forearm fascia.  The fascia released for approximately 3 cm proximal to the wrist crease under direct vision.  Canal was explored.  Tenosynovial tissue was markedly thickened.  Air compression to the nerve was apparent.  No further lesions were identified.  The wound was copiously irrigated with saline and skin closed with interrupted 4-0 nylon sutures.  Separate incision, curvilinear, was made over the proximal middle phalanx of the left middle finger, carried down through subcutaneous tissue.  Bleeders again electrocauterized with bipolar.  A large multilobulated cystic mass was immediately encountered in the dorsal aspect of the PIP joint.  This was isolated with blunt and sharp dissection and resected entrance into the PIP joint between the lateral band and central slip was noted.  This was incised.  The joint was  then debrided with a rongeur with synovectomy being performed.  The specimen was sent to Pathology.  The wound was copiously irrigated with saline.  The interval between the extensor central slip and lateral band was then closed with running 5-0 Vicryl suture.  The skin was closed with interrupted 4-0 nylon sutures. Sterile compressive dressing and splint to the finger was applied after a metacarpal block, local infiltration of the carpal tunnel incision was given 9 mL of a 0.25% bupivacaine without epinephrine was used.  On deflation of the  tourniquet, all fingers immediately pinked.  She was taken to the recovery room for observation in satisfactory condition. She will be discharged home to return to the [facility] in 1 week on Norco.    ______________________________ Daryll Brod, M.D.   ______________________________ Daryll Brod, M.D.    GK/MEDQ  D:  12/30/2014  T:  12/31/2014  Job:  [id number]

## 2015-06-01 DIAGNOSIS — M152 Bouchard's nodes (with arthropathy): Secondary | ICD-10-CM | POA: Insufficient documentation

## 2015-08-03 ENCOUNTER — Other Ambulatory Visit: Payer: Self-pay

## 2015-08-03 DIAGNOSIS — Z1231 Encounter for screening mammogram for malignant neoplasm of breast: Secondary | ICD-10-CM

## 2015-09-26 ENCOUNTER — Ambulatory Visit
Admission: RE | Admit: 2015-09-26 | Discharge: 2015-09-26 | Disposition: A | Discharge status: Home / Self Care | Payer: 59 | Source: Ambulatory Visit

## 2015-09-26 DIAGNOSIS — Z1231 Encounter for screening mammogram for malignant neoplasm of breast: Secondary | ICD-10-CM

## 2016-08-27 ENCOUNTER — Other Ambulatory Visit: Payer: Self-pay | Admitting: Obstetrics and Gynecology

## 2016-08-27 DIAGNOSIS — Z1231 Encounter for screening mammogram for malignant neoplasm of breast: Secondary | ICD-10-CM

## 2016-09-26 ENCOUNTER — Ambulatory Visit: Payer: 59

## 2016-10-05 ENCOUNTER — Ambulatory Visit: Payer: 59

## 2016-10-25 ENCOUNTER — Ambulatory Visit
Admission: RE | Admit: 2016-10-25 | Discharge: 2016-10-25 | Disposition: A | Discharge status: Home / Self Care | Payer: 59 | Source: Ambulatory Visit | Attending: Obstetrics and Gynecology | Admitting: Obstetrics and Gynecology

## 2016-10-25 DIAGNOSIS — Z1231 Encounter for screening mammogram for malignant neoplasm of breast: Secondary | ICD-10-CM | POA: Diagnosis not present

## 2017-03-26 DIAGNOSIS — Z0000 Encounter for general adult medical examination without abnormal findings: Secondary | ICD-10-CM | POA: Diagnosis not present

## 2017-04-02 DIAGNOSIS — Z1389 Encounter for screening for other disorder: Secondary | ICD-10-CM | POA: Diagnosis not present

## 2017-04-02 DIAGNOSIS — M62838 Other muscle spasm: Secondary | ICD-10-CM | POA: Diagnosis not present

## 2017-04-02 DIAGNOSIS — E7849 Other hyperlipidemia: Secondary | ICD-10-CM | POA: Diagnosis not present

## 2017-04-02 DIAGNOSIS — Z0000 Encounter for general adult medical examination without abnormal findings: Secondary | ICD-10-CM | POA: Diagnosis not present

## 2017-04-02 DIAGNOSIS — Z23 Encounter for immunization: Secondary | ICD-10-CM | POA: Diagnosis not present

## 2017-04-09 DIAGNOSIS — Z1212 Encounter for screening for malignant neoplasm of rectum: Secondary | ICD-10-CM | POA: Diagnosis not present

## 2017-07-23 ENCOUNTER — Other Ambulatory Visit: Payer: Self-pay | Admitting: Obstetrics and Gynecology

## 2017-07-23 DIAGNOSIS — Z139 Encounter for screening, unspecified: Secondary | ICD-10-CM

## 2017-08-27 DIAGNOSIS — N951 Menopausal and female climacteric states: Secondary | ICD-10-CM | POA: Diagnosis not present

## 2017-08-27 DIAGNOSIS — Z01419 Encounter for gynecological examination (general) (routine) without abnormal findings: Secondary | ICD-10-CM | POA: Diagnosis not present

## 2017-10-29 ENCOUNTER — Ambulatory Visit: Payer: 59

## 2017-12-27 ENCOUNTER — Ambulatory Visit
Admission: RE | Admit: 2017-12-27 | Discharge: 2017-12-27 | Disposition: A | Discharge status: Home / Self Care | Payer: 59 | Source: Ambulatory Visit | Attending: Obstetrics and Gynecology | Admitting: Obstetrics and Gynecology

## 2017-12-27 DIAGNOSIS — Z1231 Encounter for screening mammogram for malignant neoplasm of breast: Secondary | ICD-10-CM | POA: Diagnosis not present

## 2017-12-27 DIAGNOSIS — Z139 Encounter for screening, unspecified: Secondary | ICD-10-CM

## 2018-04-17 DIAGNOSIS — R82998 Other abnormal findings in urine: Secondary | ICD-10-CM | POA: Diagnosis not present

## 2018-04-17 DIAGNOSIS — Z0000 Encounter for general adult medical examination without abnormal findings: Secondary | ICD-10-CM | POA: Diagnosis not present

## 2018-04-24 DIAGNOSIS — E7849 Other hyperlipidemia: Secondary | ICD-10-CM | POA: Diagnosis not present

## 2018-04-24 DIAGNOSIS — M2550 Pain in unspecified joint: Secondary | ICD-10-CM | POA: Diagnosis not present

## 2018-04-24 DIAGNOSIS — Z23 Encounter for immunization: Secondary | ICD-10-CM | POA: Diagnosis not present

## 2018-04-24 DIAGNOSIS — Z1389 Encounter for screening for other disorder: Secondary | ICD-10-CM | POA: Diagnosis not present

## 2018-04-24 DIAGNOSIS — Z0000 Encounter for general adult medical examination without abnormal findings: Secondary | ICD-10-CM | POA: Diagnosis not present

## 2018-04-24 DIAGNOSIS — N390 Urinary tract infection, site not specified: Secondary | ICD-10-CM | POA: Diagnosis not present

## 2018-05-09 DIAGNOSIS — Z1212 Encounter for screening for malignant neoplasm of rectum: Secondary | ICD-10-CM | POA: Diagnosis not present

## 2018-05-13 DIAGNOSIS — E162 Hypoglycemia, unspecified: Secondary | ICD-10-CM | POA: Diagnosis not present

## 2018-05-13 DIAGNOSIS — K7689 Other specified diseases of liver: Secondary | ICD-10-CM | POA: Diagnosis not present

## 2018-05-13 DIAGNOSIS — K2900 Acute gastritis without bleeding: Secondary | ICD-10-CM | POA: Diagnosis not present

## 2018-05-13 DIAGNOSIS — R079 Chest pain, unspecified: Secondary | ICD-10-CM | POA: Diagnosis not present

## 2018-05-13 DIAGNOSIS — E161 Other hypoglycemia: Secondary | ICD-10-CM | POA: Diagnosis not present

## 2018-05-13 DIAGNOSIS — K2970 Gastritis, unspecified, without bleeding: Secondary | ICD-10-CM | POA: Diagnosis not present

## 2018-05-13 DIAGNOSIS — R0789 Other chest pain: Secondary | ICD-10-CM | POA: Diagnosis not present

## 2018-05-13 DIAGNOSIS — K219 Gastro-esophageal reflux disease without esophagitis: Secondary | ICD-10-CM | POA: Diagnosis not present

## 2018-07-15 DIAGNOSIS — L308 Other specified dermatitis: Secondary | ICD-10-CM | POA: Diagnosis not present

## 2018-08-12 ENCOUNTER — Other Ambulatory Visit: Payer: Self-pay | Admitting: Obstetrics and Gynecology

## 2018-08-12 DIAGNOSIS — Z1231 Encounter for screening mammogram for malignant neoplasm of breast: Secondary | ICD-10-CM

## 2018-12-30 ENCOUNTER — Ambulatory Visit
Admission: RE | Admit: 2018-12-30 | Discharge: 2018-12-30 | Disposition: A | Discharge status: Home / Self Care | Payer: 59 | Source: Ambulatory Visit | Attending: Obstetrics and Gynecology | Admitting: Obstetrics and Gynecology

## 2018-12-30 ENCOUNTER — Other Ambulatory Visit: Payer: Self-pay

## 2018-12-30 DIAGNOSIS — Z1231 Encounter for screening mammogram for malignant neoplasm of breast: Secondary | ICD-10-CM

## 2019-03-31 ENCOUNTER — Encounter: Payer: Self-pay | Admitting: Gastroenterology

## 2019-07-31 ENCOUNTER — Encounter: Payer: Self-pay | Admitting: Gastroenterology

## 2019-08-20 DIAGNOSIS — G5602 Carpal tunnel syndrome, left upper limb: Secondary | ICD-10-CM | POA: Insufficient documentation

## 2019-08-25 ENCOUNTER — Ambulatory Visit (AMBULATORY_SURGERY_CENTER): Payer: Self-pay

## 2019-08-25 ENCOUNTER — Other Ambulatory Visit: Payer: Self-pay

## 2019-08-25 VITALS — Temp 97.2°F | Ht 63.0 in | Wt 151.0 lb

## 2019-08-25 DIAGNOSIS — Z1211 Encounter for screening for malignant neoplasm of colon: Secondary | ICD-10-CM

## 2019-08-25 DIAGNOSIS — Z01818 Encounter for other preprocedural examination: Secondary | ICD-10-CM

## 2019-08-25 MED ORDER — NA SULFATE-K SULFATE-MG SULF 17.5-3.13-1.6 GM/177ML PO SOLN: 1.0000 | Freq: Once | ORAL | 0 refills | Status: AC

## 2019-08-25 NOTE — Progress Notes (Signed)
No egg or soy allergy known to patient  No issues with past sedation with any surgeries  or procedures, no intubation problems  No diet pills per patient No home 02 use per patient  No blood thinners per patient  Pt denies issues with constipation  No A fib or A flutter  EMMI video sent to pt's e mail   suprep coupon and code given  Due to the COVID-19 pandemic we are asking patients to follow these guidelines. Please only bring one care partner. Please be aware that your care partner may wait in the car in the parking lot or if they feel like they will be too hot to wait in the car, they may wait in the lobby on the 4th floor. All care partners are required to wear a mask the entire time (we do not have any that we can provide them), they need to practice social distancing, and we will do a Covid check for all patient's and care partners when you arrive. Also we will check their temperature and your temperature. If the care partner waits in their car they need to stay in the parking lot the entire time and we will call them on their cell phone when the patient is ready for discharge so they can bring the car to the front of the building. Also all patient's will need to wear a mask into building.  

## 2019-09-03 ENCOUNTER — Other Ambulatory Visit: Payer: Self-pay

## 2019-09-03 ENCOUNTER — Ambulatory Visit (INDEPENDENT_AMBULATORY_CARE_PROVIDER_SITE_OTHER): Payer: 59

## 2019-09-03 ENCOUNTER — Other Ambulatory Visit: Payer: Self-pay | Admitting: Gastroenterology

## 2019-09-03 DIAGNOSIS — Z1159 Encounter for screening for other viral diseases: Secondary | ICD-10-CM

## 2019-09-04 LAB — SARS CORONAVIRUS 2 (TAT 6-24 HRS): SARS Coronavirus 2: NEGATIVE

## 2019-09-07 ENCOUNTER — Encounter: Payer: Self-pay | Admitting: Gastroenterology

## 2019-09-08 ENCOUNTER — Encounter: Payer: 59 | Admitting: Gastroenterology

## 2019-09-08 ENCOUNTER — Other Ambulatory Visit: Payer: Self-pay

## 2019-09-08 ENCOUNTER — Encounter: Payer: Self-pay | Admitting: Gastroenterology

## 2019-09-08 ENCOUNTER — Ambulatory Visit (AMBULATORY_SURGERY_CENTER): Payer: 59 | Admitting: Gastroenterology

## 2019-09-08 VITALS — BP 151/69 | HR 76 | Temp 96.9°F | Resp 18 | Ht 63.0 in | Wt 151.0 lb

## 2019-09-08 DIAGNOSIS — D120 Benign neoplasm of cecum: Secondary | ICD-10-CM

## 2019-09-08 DIAGNOSIS — Z1211 Encounter for screening for malignant neoplasm of colon: Secondary | ICD-10-CM | POA: Diagnosis present

## 2019-09-08 DIAGNOSIS — D122 Benign neoplasm of ascending colon: Secondary | ICD-10-CM

## 2019-09-08 MED ORDER — SODIUM CHLORIDE 0.9 % IV SOLN: 500.0000 mL | Freq: Once | INTRAVENOUS | Status: DC

## 2019-09-08 NOTE — Progress Notes (Signed)
To PACU, VSS. Report to [email or web address] 

## 2019-09-08 NOTE — Progress Notes (Signed)
Pt's states no medical or surgical changes since previsit or office visit.  SH IV, LC temps and DT vitals.

## 2019-09-08 NOTE — Progress Notes (Signed)
Called to room to assist during endoscopic procedure.  Patient ID and intended procedure confirmed with present staff. Received instructions for my participation in the procedure from the performing physician.  

## 2019-09-08 NOTE — Op Note (Signed)
[facility] Patient Name: Debra Humphrey Procedure Date: 09/08/2019 7:17 AM MRN: [id number] Endoscopist: Thornton Park MD, MD Age: 64 Referring MD:  Date of Birth: 1956-06-22 Gender: Female Account #: [id number] Procedure:                Colonoscopy Indications:              Screening for colorectal malignant neoplasm                           No polyps on colonoscopy with Dr. Olevia Perches 04/05/2009                           No known family history of colon cancer or polyps Medicines:                Monitored Anesthesia Care Procedure:                Pre-Anesthesia Assessment:                           - Prior to the procedure, a History and Physical                            was performed, and patient medications and                            allergies were reviewed. The patient's tolerance of                            previous anesthesia was also reviewed. The risks                            and benefits of the procedure and the sedation                            options and risks were discussed with the patient.                            All questions were answered, and informed consent                            was obtained. Prior Anticoagulants: The patient has                            taken no previous anticoagulant or antiplatelet                            agents. ASA Grade Assessment: II - A patient with                            mild systemic disease. After reviewing the risks                            and benefits, the patient was deemed in  satisfactory condition to undergo the procedure.                           After obtaining informed consent, the colonoscope                            was passed under direct vision. Throughout the                            procedure, the patient's blood pressure, pulse, and                            oxygen saturations were monitored continuously. The                            Colonoscope  was introduced through the anus and                            advanced to the 3 cm into the ileum. Water                            immersion was performed in the sigmoid colon. A                            second forward look of the right colon was                            performed. The colonoscopy was performed with                            moderate difficulty due to a redundant colon,                            significant looping and a tortuous colon.                            Successful completion of the procedure was aided by                            withdrawing the scope and replacing with the                            pediatric endoscope. The patient tolerated the                            procedure well. The quality of the bowel                            preparation was good. The terminal ileum, ileocecal                            valve, appendiceal orifice, and rectum were  photographed. Scope In: 8:10:20 AM Scope Out: 8:35:45 AM Scope Withdrawal Time: 0 hours 15 minutes 13 seconds  Total Procedure Duration: 0 hours 25 minutes 25 seconds  Findings:                 The perianal and digital rectal examinations were                            normal.                           Multiple small and large-mouthed diverticula were                            found in the sigmoid colon.                           A 4 mm polyp was found in the ascending colon. The                            polyp was pedunculated. The polyp was removed with                            a cold snare. Resection and retrieval were                            complete. Estimated blood loss was minimal.                           A 6 mm polyp was found in the cecum. The polyp was                            flat. The polyp was removed with a cold snare.                            Resection and retrieval were complete. Estimated                            blood loss was  minimal.                           The exam was otherwise without abnormality on                            direct and retroflexion views. Complications:            No immediate complications. Estimated blood loss:                            Minimal. Estimated Blood Loss:     Estimated blood loss was minimal. Impression:               - Diverticulosis in the sigmoid colon.                           - One 6 mm polyp in the ascending  colon, removed                            with a cold snare. Resected and retrieved.                           - One 4 mm polyp in the cecum, removed with a cold                            snare. Resected and retrieved.                           - The examination was otherwise normal on direct                            and retroflexion views. Recommendation:           - Patient has a contact number available for                            emergencies. The signs and symptoms of potential                            delayed complications were discussed with the                            patient. Return to normal activities tomorrow.                            Written discharge instructions were provided to the                            patient.                           - Continue present medications.                           - Await pathology results.                           - Repeat colonoscopy date to be determined after                            pending pathology results are reviewed for                            surveillance.                           - Follow a high fiber diet. Drink at least 64                            ounces of water daily. Add a daily stool bulking  agent such as psyllium (an exampled would be                            Metamucil).                           - Emerging evidence supports eating a diet of                            fruits, vegetables, grains, calcium, and yogurt                             while reducing red meat and alcohol may reduce the                            risk of colon cancer.                           - Thank you for allowing me to be involved in your                            colon cancer prevention. Thornton Park MD, MD 09/08/2019 8:44:05 AM This report has been signed electronically.

## 2019-09-08 NOTE — Patient Instructions (Signed)
Handouts given for polyps, diverticulosis and high fiber diet.  YOU HAD AN ENDOSCOPIC PROCEDURE TODAY AT [facility]:   Refer to the procedure report that was given to you for any specific questions about what was found during the examination.  If the procedure report does not answer your questions, please call your gastroenterologist to clarify.  If you requested that your care partner not be given the details of your procedure findings, then the procedure report has been included in a sealed envelope for you to review at your convenience later.  YOU SHOULD EXPECT: Some feelings of bloating in the abdomen. Passage of more gas than usual.  Walking can help get rid of the air that was put into your GI tract during the procedure and reduce the bloating. If you had a lower endoscopy (such as a colonoscopy or flexible sigmoidoscopy) you may notice spotting of blood in your stool or on the toilet paper. If you underwent a bowel prep for your procedure, you may not have a normal bowel movement for a few days.  Please Note:  You might notice some irritation and congestion in your nose or some drainage.  This is from the oxygen used during your procedure.  There is no need for concern and it should clear up in a day or so.  SYMPTOMS TO REPORT IMMEDIATELY:   Following lower endoscopy (colonoscopy or flexible sigmoidoscopy):  Excessive amounts of blood in the stool  Significant tenderness or worsening of abdominal pains  Swelling of the abdomen that is new, acute  Fever of 100F or higher  For urgent or emergent issues, a gastroenterologist can be reached at any hour by calling [phone]. Do not use MyChart messaging for urgent concerns.    DIET:  We do recommend a small meal at first, but then you may proceed to your regular diet.  Drink plenty of fluids but you should avoid alcoholic beverages for 24 hours.  ACTIVITY:  You should plan to take it easy for the rest of today and you  should NOT DRIVE or use heavy machinery until tomorrow (because of the sedation medicines used during the test).    FOLLOW UP: Our staff will call the number listed on your records 48-72 hours following your procedure to check on you and address any questions or concerns that you may have regarding the information given to you following your procedure. If we do not reach you, we will leave a message.  We will attempt to reach you two times.  During this call, we will ask if you have developed any symptoms of COVID 19. If you develop any symptoms (ie: fever, flu-like symptoms, shortness of breath, cough etc.) before then, please call [phone].  If you test positive for Covid 19 in the 2 weeks post procedure, please call and report this information to [place].    If any biopsies were taken you will be contacted by phone or by letter within the next 1-3 weeks.  Please call us at [phone] if you have not heard about the biopsies in 3 weeks.    SIGNATURES/CONFIDENTIALITY: You and/or your care partner have signed paperwork which will be entered into your electronic medical record.  These signatures attest to the fact that that the information above on your After Visit Summary has been reviewed and is understood.  Full responsibility of the confidentiality of this discharge information lies with you and/or your care-partner.

## 2019-09-10 ENCOUNTER — Telehealth: Payer: Self-pay | Admitting: *Deleted

## 2019-09-10 ENCOUNTER — Encounter: Payer: Self-pay | Admitting: Gastroenterology

## 2019-09-10 NOTE — Telephone Encounter (Signed)
  Follow up Call-  Call back number 09/08/2019  Post procedure Call Back phone  # [phone]  Permission to leave phone message Yes  Some recent data might be hidden     Patient questions:  Message left to call us if necessary.

## 2019-09-10 NOTE — Telephone Encounter (Signed)
Attempted second f/u phone call. No answer. Left message.  

## 2019-11-19 ENCOUNTER — Other Ambulatory Visit: Payer: Self-pay | Admitting: Obstetrics and Gynecology

## 2019-11-19 DIAGNOSIS — Z1231 Encounter for screening mammogram for malignant neoplasm of breast: Secondary | ICD-10-CM

## 2019-12-31 ENCOUNTER — Ambulatory Visit: Payer: 59

## 2019-12-31 ENCOUNTER — Ambulatory Visit
Admission: RE | Admit: 2019-12-31 | Discharge: 2019-12-31 | Disposition: A | Discharge status: Home / Self Care | Payer: 59 | Source: Ambulatory Visit | Attending: Obstetrics and Gynecology | Admitting: Obstetrics and Gynecology

## 2019-12-31 ENCOUNTER — Other Ambulatory Visit: Payer: Self-pay

## 2019-12-31 DIAGNOSIS — Z1231 Encounter for screening mammogram for malignant neoplasm of breast: Secondary | ICD-10-CM

## 2020-07-25 ENCOUNTER — Other Ambulatory Visit: Payer: Self-pay | Admitting: Obstetrics and Gynecology

## 2020-07-25 DIAGNOSIS — Z1231 Encounter for screening mammogram for malignant neoplasm of breast: Secondary | ICD-10-CM

## 2021-01-03 ENCOUNTER — Ambulatory Visit: Payer: 59

## 2021-02-20 ENCOUNTER — Other Ambulatory Visit: Payer: Self-pay

## 2021-02-20 ENCOUNTER — Ambulatory Visit
Admission: RE | Admit: 2021-02-20 | Discharge: 2021-02-20 | Disposition: A | Discharge status: Home / Self Care | Payer: 59 | Source: Ambulatory Visit | Attending: Obstetrics and Gynecology | Admitting: Obstetrics and Gynecology

## 2021-02-20 DIAGNOSIS — Z1231 Encounter for screening mammogram for malignant neoplasm of breast: Secondary | ICD-10-CM

## 2021-07-05 ENCOUNTER — Other Ambulatory Visit: Payer: Self-pay | Admitting: Obstetrics and Gynecology

## 2021-07-05 DIAGNOSIS — Z1231 Encounter for screening mammogram for malignant neoplasm of breast: Secondary | ICD-10-CM

## 2022-02-21 ENCOUNTER — Ambulatory Visit
Admission: RE | Admit: 2022-02-21 | Discharge: 2022-02-21 | Disposition: A | Discharge status: Home / Self Care | Payer: Medicare Other | Source: Ambulatory Visit | Attending: Obstetrics and Gynecology | Admitting: Obstetrics and Gynecology

## 2022-02-21 DIAGNOSIS — Z1231 Encounter for screening mammogram for malignant neoplasm of breast: Secondary | ICD-10-CM

## 2023-03-14 ENCOUNTER — Other Ambulatory Visit: Payer: Self-pay | Admitting: Internal Medicine

## 2023-03-14 DIAGNOSIS — Z1231 Encounter for screening mammogram for malignant neoplasm of breast: Secondary | ICD-10-CM

## 2023-04-05 ENCOUNTER — Ambulatory Visit
Admission: RE | Admit: 2023-04-05 | Discharge: 2023-04-05 | Disposition: A | Discharge status: Home / Self Care | Payer: Medicare Other | Source: Ambulatory Visit | Attending: Internal Medicine | Admitting: Internal Medicine

## 2023-04-05 DIAGNOSIS — Z1231 Encounter for screening mammogram for malignant neoplasm of breast: Secondary | ICD-10-CM

## 2024-04-28 ENCOUNTER — Other Ambulatory Visit: Payer: Self-pay | Admitting: Obstetrics and Gynecology

## 2024-04-28 DIAGNOSIS — Z1231 Encounter for screening mammogram for malignant neoplasm of breast: Secondary | ICD-10-CM

## 2024-05-20 ENCOUNTER — Ambulatory Visit
Admission: RE | Admit: 2024-05-20 | Discharge: 2024-05-20 | Disposition: A | Source: Ambulatory Visit | Attending: Obstetrics and Gynecology | Admitting: Obstetrics and Gynecology

## 2024-05-20 DIAGNOSIS — Z1231 Encounter for screening mammogram for malignant neoplasm of breast: Secondary | ICD-10-CM

## 2024-05-28 ENCOUNTER — Other Ambulatory Visit: Payer: Self-pay | Admitting: Obstetrics and Gynecology

## 2024-05-28 DIAGNOSIS — R928 Other abnormal and inconclusive findings on diagnostic imaging of breast: Secondary | ICD-10-CM

## 2024-06-10 ENCOUNTER — Inpatient Hospital Stay
Admission: RE | Admit: 2024-06-10 | Discharge: 2024-06-10 | Attending: Obstetrics and Gynecology | Admitting: Obstetrics and Gynecology

## 2024-06-10 DIAGNOSIS — R928 Other abnormal and inconclusive findings on diagnostic imaging of breast: Secondary | ICD-10-CM
